# Patient Record
Sex: Female | Born: 1950 | Race: White | Hispanic: No | Smoking: Former smoker
Health system: Southern US, Community
[De-identification: ages and names within clinical notes are randomized; demographics above are authoritative.]

## PROBLEM LIST (undated history)

## (undated) DIAGNOSIS — L409 Psoriasis, unspecified: Secondary | ICD-10-CM

## (undated) DIAGNOSIS — F0280 Dementia in other diseases classified elsewhere without behavioral disturbance: Principal | ICD-10-CM

## (undated) DIAGNOSIS — B351 Tinea unguium: Secondary | ICD-10-CM

## (undated) DIAGNOSIS — I10 Essential (primary) hypertension: Secondary | ICD-10-CM

## (undated) DIAGNOSIS — E559 Vitamin D deficiency, unspecified: Secondary | ICD-10-CM

## (undated) DIAGNOSIS — M8580 Other specified disorders of bone density and structure, unspecified site: Secondary | ICD-10-CM

## (undated) DIAGNOSIS — H409 Unspecified glaucoma: Secondary | ICD-10-CM

## (undated) DIAGNOSIS — Z550 Illiteracy and low-level literacy: Secondary | ICD-10-CM

## (undated) DIAGNOSIS — G309 Alzheimer's disease, unspecified: Principal | ICD-10-CM

## (undated) DIAGNOSIS — R001 Bradycardia, unspecified: Secondary | ICD-10-CM

## (undated) HISTORY — DX: Essential (primary) hypertension: I10

## (undated) HISTORY — PX: TONSILLECTOMY: SUR1361

## (undated) HISTORY — DX: Dementia in other diseases classified elsewhere without behavioral disturbance: F02.80

## (undated) HISTORY — DX: Vitamin D deficiency, unspecified: E55.9

## (undated) HISTORY — DX: Alzheimer's disease, unspecified: G30.9

## (undated) HISTORY — PX: TUBAL LIGATION: SHX77

## (undated) HISTORY — DX: Other specified disorders of bone density and structure, unspecified site: M85.80

## (undated) HISTORY — DX: Tinea unguium: B35.1

## (undated) HISTORY — DX: Psoriasis, unspecified: L40.9

## (undated) HISTORY — DX: Bradycardia, unspecified: R00.1

## (undated) HISTORY — DX: Unspecified glaucoma: H40.9

## (undated) HISTORY — DX: Illiteracy and low-level literacy: Z55.0

## (undated) HISTORY — PX: CHOLECYSTECTOMY: SHX55

---

## 1968-01-12 HISTORY — PX: APPENDECTOMY: SHX54

## 1986-01-11 HISTORY — PX: OTHER SURGICAL HISTORY: SHX169

## 2012-05-24 HISTORY — PX: OTHER SURGICAL HISTORY: SHX169

## 2012-05-25 ENCOUNTER — Other Ambulatory Visit: Payer: Self-pay | Admitting: Physician Assistant

## 2012-05-25 ENCOUNTER — Other Ambulatory Visit (HOSPITAL_COMMUNITY)
Admission: RE | Admit: 2012-05-25 | Discharge: 2012-05-25 | Disposition: A | Discharge status: Home / Self Care | Payer: BC Managed Care – PPO | Source: Ambulatory Visit | Attending: Family Medicine | Admitting: Family Medicine

## 2012-05-25 DIAGNOSIS — Z01419 Encounter for gynecological examination (general) (routine) without abnormal findings: Secondary | ICD-10-CM | POA: Insufficient documentation

## 2012-07-06 ENCOUNTER — Other Ambulatory Visit: Payer: Self-pay

## 2012-07-06 DIAGNOSIS — Z1231 Encounter for screening mammogram for malignant neoplasm of breast: Secondary | ICD-10-CM

## 2012-07-20 ENCOUNTER — Ambulatory Visit: Payer: BC Managed Care – PPO

## 2012-08-09 ENCOUNTER — Other Ambulatory Visit: Payer: Self-pay | Admitting: Gastroenterology

## 2012-08-28 ENCOUNTER — Ambulatory Visit
Admission: RE | Admit: 2012-08-28 | Discharge: 2012-08-28 | Disposition: A | Discharge status: Home / Self Care | Payer: BC Managed Care – PPO | Source: Ambulatory Visit

## 2012-08-28 DIAGNOSIS — Z1231 Encounter for screening mammogram for malignant neoplasm of breast: Secondary | ICD-10-CM

## 2014-03-18 DIAGNOSIS — L409 Psoriasis, unspecified: Secondary | ICD-10-CM | POA: Diagnosis not present

## 2014-03-18 DIAGNOSIS — R413 Other amnesia: Secondary | ICD-10-CM | POA: Diagnosis not present

## 2014-03-19 ENCOUNTER — Other Ambulatory Visit: Payer: Self-pay | Admitting: Family Medicine

## 2014-03-19 DIAGNOSIS — R413 Other amnesia: Secondary | ICD-10-CM

## 2014-04-26 DIAGNOSIS — R944 Abnormal results of kidney function studies: Secondary | ICD-10-CM | POA: Diagnosis not present

## 2014-05-03 ENCOUNTER — Other Ambulatory Visit: Payer: Self-pay

## 2014-05-15 ENCOUNTER — Ambulatory Visit
Admission: RE | Admit: 2014-05-15 | Discharge: 2014-05-15 | Disposition: A | Discharge status: Home / Self Care | Payer: Medicare Other | Source: Ambulatory Visit | Attending: Family Medicine | Admitting: Family Medicine

## 2014-05-15 DIAGNOSIS — F0390 Unspecified dementia without behavioral disturbance: Secondary | ICD-10-CM | POA: Diagnosis not present

## 2014-05-15 DIAGNOSIS — R413 Other amnesia: Secondary | ICD-10-CM | POA: Diagnosis not present

## 2014-06-05 DIAGNOSIS — L400 Psoriasis vulgaris: Secondary | ICD-10-CM | POA: Diagnosis not present

## 2014-06-21 ENCOUNTER — Ambulatory Visit (INDEPENDENT_AMBULATORY_CARE_PROVIDER_SITE_OTHER): Payer: Medicare Other | Admitting: Neurology

## 2014-06-21 ENCOUNTER — Encounter: Payer: Self-pay | Admitting: Neurology

## 2014-06-21 VITALS — BP 148/80 | HR 79 | Ht 64.0 in | Wt 151.6 lb

## 2014-06-21 DIAGNOSIS — G309 Alzheimer's disease, unspecified: Secondary | ICD-10-CM | POA: Diagnosis not present

## 2014-06-21 DIAGNOSIS — E538 Deficiency of other specified B group vitamins: Secondary | ICD-10-CM | POA: Diagnosis not present

## 2014-06-21 DIAGNOSIS — F0280 Dementia in other diseases classified elsewhere without behavioral disturbance: Secondary | ICD-10-CM

## 2014-06-21 HISTORY — DX: Dementia in other diseases classified elsewhere, unspecified severity, without behavioral disturbance, psychotic disturbance, mood disturbance, and anxiety: F02.80

## 2014-06-21 MED ORDER — SERTRALINE HCL 50 MG PO TABS: ORAL_TABLET | ORAL | Status: AC

## 2014-06-21 NOTE — Patient Instructions (Addendum)
Will begin Zoloft as an antidepressive medication, contact our office in about 4 weeks. If she is tolerating the Zoloft well, we will add a medication for memory at that time.   Alzheimer Disease Alzheimer disease is a mental disorder. It causes memory loss and loss of other mental functions, such as learning, thinking, problem solving, communicating, and completing tasks. The mental losses interfere with the ability to perform daily activities at work, at home, or in social situations. Alzheimer disease usually starts in a person's late 1s or early 68s but can start earlier in life (familial form). The mental changes caused by this disease are permanent and worsen over time. As the illness progresses, the ability to do even the simplest things is lost. Survival with Alzheimer disease ranges from several years to as long as 20 years. CAUSES Alzheimer disease is caused by abnormally high levels of a protein (beta-amyloid) in the brain. This protein forms very small deposits within and around the brain's nerve cells. These deposits prevent the nerve cells from working properly. Experts are not certain what causes the beta-amyloid deposits in this disease. RISK FACTORS The following major risk factors have been identified:  Increasing age.  Certain genetic variations, such as Down syndrome (trisomy 21). SYMPTOMS In the early stages of Alzheimer disease, you are still able to perform daily activities but need greater effort, more time, or memory aids. Early symptoms include:  Mild memory loss of recent events, names, or phone numbers.  Loss of objects.  Minor loss of vocabulary.  Difficulty with complex tasks, such as paying bills or driving in unfamiliar locations. Other mental functions deteriorate as the disease worsens. These changes slowly go from mild to severe. Symptoms at this stage include:  Difficulty remembering. You may not be able to recall personal information such as your  address and telephone number. You may become confused about the date, the season of the year, or your location.  Difficulty maintaining attention. You may forget what you wanted to say during conversations and repeat what you have already said.  Difficulty learning new information or tasks. You may not remember what you read or the name of a new friend you met.  Difficulty counting or doing math. You may have difficulty with complex math problems. You may make mistakes in paying bills or managing your checkbook.  Poor reasoning and judgment. You may make poor decisions or not dress right for the weather.  Difficulty communicating. You may have regular difficulty remembering words, naming objects, expressing yourself clearly, or writing sentences that make sense.  Difficulty performing familiar daily activities. You may get lost driving in familiar locations or need help eating, bathing, dressing, grooming, or using the toilet. You may have difficulty maintaining bladder or bowel control.  Difficulty recognizing familiar faces. You may confuse family members or close friends with one another. You may not recognize a close relative or may mistake strangers for family. Alzheimer disease also may cause changes in personality and behavior. These changes include:   Loss of interest or motivation.  Social withdrawal.  Anxiety.  Difficulty sleeping.  Uncharacteristic anger or combativeness.  A false belief that someone is trying to harm you (paranoia).  Seeing things that are not real (hallucinations).  Agitation. Confusion and disruptive behavior are often worse at night and may be triggered by changes in the environment or acute medical issues. DIAGNOSIS  Alzheimer disease is diagnosed through an assessment by your health care provider. During this assessment, your health  care provider will do the following:  Ask you and your family, friends, or caregivers questions about your symptoms,  their frequency, their duration and progression, and the effect they are having on your life.  Ask questions about your personal and family medical history and use of alcohol or drugs, including prescription medicine.  Perform a physical exam and order blood tests and brain imaging exams. Your health care provider may refer you to a specialist for detailed evaluation of your mental functions (neuropsychological testing).  Many different brain disorders, medical conditions, and certain substances can cause symptoms that resemble Alzheimer disease symptoms. These must be ruled out before this disease can be diagnosed. If Alzheimer disease is diagnosed, it will be considered either "possible" or "probable" Alzheimer disease. "Possible" Alzheimer disease means that your symptoms are typical of the disease and no other disorder is causing them. "Probable" Alzheimer disease means that you also have a family history of the disease or genetic test results that support the diagnosis. Certain tests, mostly used in research studies, are highly specific for Alzheimer disease.  TREATMENT  There is currently no cure for this disease. The goals of treatment are to:  Slow down the progression of the disease.  Preserve mental function as long as possible.  Manage behavioral symptoms.  Make life easier for the person with Alzheimer disease and his or her caregivers. The following treatment options are available:  Medicine. Certain medicines may help slow memory loss by changing the level of certain chemicals in the brain. Medicine may also help with behavioral symptoms.  Talk therapy. Talk therapy provides education, support, and memory aids for people with this disease. It is most effective in the early stages of the illness.  Caregiving. Caregivers may be family members, friends, or trained medical professionals. They help the person with Alzheimer disease with daily life activities. Caregiving may take place  at home or at a nursing facility.  Family support groups. These provide education, emotional support, and information about community resources to family members who are taking care of the person with this disease. Document Released: 09/09/2003 Document Revised: 05/14/2013 Document Reviewed: 05/05/2012 [facility] Patient Information 2015 [place], [place]. This information is not intended to replace advice given to you by your health care provider. Make sure you discuss any questions you have with your health care provider.

## 2014-06-21 NOTE — Progress Notes (Signed)
Reason for visit: Dementia  Referring physician: Dr. Johnette Abraham is a 64 y.o. female  History of present illness:  Briana Taylor is a 64 year old right-handed white female with a history of a progressive memory disorder. The patient apparently has never been a high functioning individual, and she has always been dependent upon others to meet her day-to-day needs. Her daughter has been living with her since 2007, the patient has always been mentally slow. She was able to do laundry, cook, and take care of her personal needs without difficulty. The daughter got married in June 2015, and within 2 months of this, the patient began becoming oppositional, refusing to do any household chores. The patient has progressively worsened with her behavior over time, she is refusing to dress herself or take a bath without prompting. The patient has had a decrease in her oral intake of food and fluids, and this issue requires supervision. The patient has begun eating cat food more recently. She sleeps during the day, and is up and down at night. She will talk herself in a self degradating manner, and voice suicidal wishes. The patient has lost interest in activities, she no longer does crossword puzzles or does coloring as she used to. The patient has had blood work done that shows a low normal vitamin B12 level of 215. MRI evaluation of the brain shows some atrophy and minimal small vessel changes. The patient comes to this office for an evaluation.  Past Medical History  Diagnosis Date  . Glaucoma   . Psoriasis   . Illiterate     learning disability  . Hypertension   . Vitamin D deficiency   . Bradycardia   . Tinea unguium   . Glaucoma   . Osteopenia   . Alzheimer's disease 06/21/2014    Past Surgical History  Procedure Laterality Date  . Appendectomy  1970  . Fx thumb  1988  . Tonsillectomy    . Right glaucoma  05/24/12  . Cholecystectomy    . Tubal ligation      Family History  Problem  Relation Age of Onset  . Heart attack Mother   . Hyperlipidemia Mother   . Diabetes Mother   . Hypertension Mother   . CAD Mother   . Hypertension Maternal Grandmother   . Hypertension Maternal Grandfather   . Hypertension Paternal Grandmother   . Hypertension Paternal Grandfather     Social history:  reports that she has quit smoking. She has never used smokeless tobacco. She reports that she does not drink alcohol or use illicit drugs.  Medications:  Prior to Admission medications   Medication Sig Start Date End Date Taking? Authorizing Provider  b complex vitamins tablet Take 1 tablet by mouth daily.   Yes Historical Provider, MD  calcium-vitamin D (OSCAL) 250-125 MG-UNIT per tablet Take 1 tablet by mouth daily.   Yes Historical Provider, MD  Multiple Vitamins-Minerals (CENTRUM SILVER ADULT 50+ PO) Take by mouth.   Yes Historical Provider, MD  Omega-3 Fatty Acids (OMEGA 3 PO) Take 1 capsule by mouth daily.   Yes Historical Provider, MD  clobetasol cream (TEMOVATE) 0.05 % Apply 1 application topically 2 (two) times daily.    Historical Provider, MD     No Known Allergies  ROS:  Out of a complete 14 system review of symptoms, the patient complains only of the following symptoms, and all other reviewed systems are negative.  Psoriasis Memory loss, confusion, headache, tremor Depression, anxiety, too  much sleep, decreased energy, disinterest in activities, suicidal thoughts Sleepiness  Blood pressure 148/80, pulse 79, height  (1.626 m), weight 151 lb 9.6 oz (68.765 kg).  Physical Exam  General: The patient is alert and cooperative at the time of the examination.  Eyes: Pupils are equal, round, and reactive to light. Discs are flat bilaterally.  Neck: The neck is supple, no carotid bruits are noted.  Respiratory: The respiratory examination is clear.  Cardiovascular: The cardiovascular examination reveals a regular rate and rhythm, no obvious murmurs or rubs are  noted.  Skin: Extremities are without significant edema. Psoriasis lesions are seen throughout on the arms and legs.  Neurologic Exam  Mental status: The patient is alert and oriented x 2 at the time of the examination (the patient is not oriented to date). The Mini-Mental Status Examination done today shows a total score of 17/30. The patient is able to name 7 animals in 30 seconds.  Cranial nerves: Facial symmetry is present. There is good sensation of the face to pinprick and soft touch bilaterally. The strength of the facial muscles and the muscles to head turning and shoulder shrug are normal bilaterally. Speech is well enunciated, no aphasia or dysarthria is noted. Extraocular movements are full. Visual fields are full. The tongue is midline, and the patient has symmetric elevation of the soft palate. No obvious hearing deficits are noted.  Motor: The motor testing reveals 5 over 5 strength of all 4 extremities. Good symmetric motor tone is noted throughout.  Sensory: Sensory testing is intact to pinprick, soft touch, vibration sensation, and position sense on all 4 extremities. No evidence of extinction is noted.  Coordination: Cerebellar testing reveals good finger-nose-finger and heel-to-shin bilaterally.  Gait and station: Gait is normal. Tandem gait is slightly unsteady. Romberg is negative. No drift is seen.  Reflexes: Deep tendon reflexes are symmetric and normal bilaterally. Toes are downgoing bilaterally.   MRI brain 05/15/14:  IMPRESSION: 1. No acute intracranial abnormality. 2. Nonspecific mild generalized atrophy and white matter change, advanced for age. This may be related to chronic microvascular ischemic disease. 3. The study is moderately degraded by patient motion  * MRI scan images were reviewed online. I agree with the written report.    Assessment/Plan:  1. Dementing illness  2. Depression  The patient likely has a combination of a progressive  dementing illness along with depression. The patient has always functioned at a relatively low level, but the patient recently has had a decline in her ability to perform activities of daily living. The clinical examination does not show significant apraxia. The patient appears to be functioning at a lower level than would be expected from the Mini-Mental Status Examination scores. Depression is likely driving much of her current behavioral issues. The patient will be placed on Zoloft, and the family will contact me after 4 weeks. If she is doing well with this, we will add Namenda to the medication regimen. The patient will follow-up in 6 months. A methylmalonic acid level rechecked today. The patient will go on low-dose aspirin.  Marlan Palau MD 06/21/2014 6:40 PM  [facility] [address] [place], [place] [address]  Phone [phone] Fax [phone]

## 2014-06-24 LAB — METHYLMALONIC ACID, SERUM: METHYLMALONIC ACID: 296 nmol/L (ref 0–378)

## 2014-06-25 ENCOUNTER — Telehealth: Payer: Self-pay

## 2014-06-25 NOTE — Telephone Encounter (Signed)
-----   Message from York Spaniel, MD sent at 06/24/2014  4:56 PM EDT -----  The blood work results are unremarkable. Please call the patient.  ----- Message -----    From: [facility] Lab Results In Interface    Sent: 06/24/2014   4:38 PM      To: York Spaniel, MD

## 2014-06-25 NOTE — Telephone Encounter (Signed)
I spoke to Hidalgo and relayed results.

## 2014-07-29 ENCOUNTER — Telehealth: Payer: Self-pay | Admitting: Neurology

## 2014-07-29 MED ORDER — MEMANTINE HCL 28 X 5 MG & 21 X 10 MG PO TABS: ORAL_TABLET | ORAL | Status: DC

## 2014-07-29 NOTE — Telephone Encounter (Signed)
I called the patient, talk with a caregiver. The patient is at increased issues with agitation, confused behavior. I will start Namenda at this time. They are to contact me when they finished the Dosepak and I will call in a maintenance dose for her.

## 2014-07-29 NOTE — Telephone Encounter (Signed)
Patients daughter called and requested to speak with the nurse regarding the medication Dr. Anne HahnWillis gave to the patient during her last apt. Cicero Duckrika states that the patient has been using the bathroom in the house plants and this concerns her. She would like to know if she can start medication to help with her dementia. Please call and advise.

## 2014-07-31 ENCOUNTER — Other Ambulatory Visit: Payer: Self-pay

## 2014-07-31 ENCOUNTER — Telehealth: Payer: Self-pay | Admitting: Neurology

## 2014-07-31 DIAGNOSIS — Z1231 Encounter for screening mammogram for malignant neoplasm of breast: Secondary | ICD-10-CM

## 2014-07-31 NOTE — Telephone Encounter (Signed)
Pts , daughter called to talk about medication, would like to talk with someone about the new medication , is it to used with Zoloft as well? The daughter is very concerned about her mothers medication and what to do in general. Please call and advise [phone]- work number.

## 2014-07-31 NOTE — Telephone Encounter (Signed)
I called Briana Taylor and left a voicemail.

## 2014-08-01 NOTE — Telephone Encounter (Signed)
Erika returned call. Please call and advise.

## 2014-08-01 NOTE — Telephone Encounter (Signed)
I called the patient's daughter. I explained that Dr. Anne Hahn wanted the patient to continue taking the Zoloft and add the Namenda. I also explained the titration pak to the patient's daughter and that she would need to call back when she is nearing the end of the Namenda so we can call in a maintenance dose. She verbalized understanding and will call back if she has anymore questions.

## 2014-08-27 DIAGNOSIS — F329 Major depressive disorder, single episode, unspecified: Secondary | ICD-10-CM | POA: Diagnosis not present

## 2014-08-27 DIAGNOSIS — G309 Alzheimer's disease, unspecified: Secondary | ICD-10-CM | POA: Diagnosis not present

## 2014-08-27 DIAGNOSIS — I10 Essential (primary) hypertension: Secondary | ICD-10-CM | POA: Diagnosis not present

## 2014-08-27 DIAGNOSIS — N183 Chronic kidney disease, stage 3 (moderate): Secondary | ICD-10-CM | POA: Diagnosis not present

## 2014-09-12 ENCOUNTER — Ambulatory Visit
Admission: RE | Admit: 2014-09-12 | Discharge: 2014-09-12 | Disposition: A | Discharge status: Home / Self Care | Payer: Medicare Other | Source: Ambulatory Visit

## 2014-09-12 DIAGNOSIS — Z1231 Encounter for screening mammogram for malignant neoplasm of breast: Secondary | ICD-10-CM

## 2014-10-18 ENCOUNTER — Other Ambulatory Visit: Payer: Self-pay | Admitting: Neurology

## 2014-10-18 MED ORDER — MEMANTINE HCL 10 MG PO TABS: 10.0000 mg | ORAL_TABLET | Freq: Two times a day (BID) | ORAL | Status: AC

## 2014-10-26 DIAGNOSIS — H5213 Myopia, bilateral: Secondary | ICD-10-CM | POA: Diagnosis not present

## 2014-10-26 DIAGNOSIS — H524 Presbyopia: Secondary | ICD-10-CM | POA: Diagnosis not present

## 2014-10-26 DIAGNOSIS — H401130 Primary open-angle glaucoma, bilateral, stage unspecified: Secondary | ICD-10-CM | POA: Diagnosis not present

## 2014-10-26 DIAGNOSIS — H40033 Anatomical narrow angle, bilateral: Secondary | ICD-10-CM | POA: Diagnosis not present

## 2014-10-26 DIAGNOSIS — H52223 Regular astigmatism, bilateral: Secondary | ICD-10-CM | POA: Diagnosis not present

## 2014-12-25 ENCOUNTER — Ambulatory Visit: Payer: Medicare Other | Admitting: Adult Health

## 2014-12-26 ENCOUNTER — Encounter: Payer: Self-pay | Admitting: Adult Health

## 2015-08-11 ENCOUNTER — Other Ambulatory Visit: Payer: Self-pay | Admitting: Family Medicine

## 2015-08-11 DIAGNOSIS — Z1231 Encounter for screening mammogram for malignant neoplasm of breast: Secondary | ICD-10-CM

## 2015-08-29 ENCOUNTER — Telehealth: Payer: Self-pay

## 2015-08-29 NOTE — Telephone Encounter (Addendum)
Patient was in the lobby waiting for someone who was being seen in our office today. She had been witnessed by several patients and employees eating her stool from her depends diapers. Gwen was concerned and wanted someone to check on the patient and see what we needed to do since she was not a patient at our office. When I went to check on the patient because we were instructed by Dr.Blyth to sit the patient in a room away from other patient's, she was reaching into her pants and I witnessed her sucking the stool off of her fingers. I asked did she need to use the bathroom and she initially said no, I told her I would help her and then she said yes. When I helped her into the restroom with the assistance of Festus Barrenanesha and Clydie BraunKaren, we cleaned her up and got another depends diaper from the ED. She has diaper rash on her bottom, and sore all over her body.  I asked had she eaten and she said no, she said she was very hungry. I explained to Dr.Lowne what was going on and was [facility] that we had some pizza left in the back from lunch. Dr.Lowne advised it was ok to give the pizza and we warmed it up and gave it to her, Dr.Paz gave us a Sprit Zero to give to the patient. She ate the food and said she felt better. I asked the patient some questions regarding her living situation because she said she was hungry and she said she did not have much food at home, and she daughter does not come and see her, she stated that she and Zollie BeckersWalter took care of each other. She said she did not drive and Zollie BeckersWalter took her everywhere. She gave me her name and her daughter's name and then told me her daughter lived at home with her, but she never see's her. I discussed with D.Lowne, Abner GreenspanBlyth and Ramon Dredgedward who advised to call APS. I made the call and left a VM, I will follow up on Monday. [place] has been notified.

## 2015-09-01 NOTE — Telephone Encounter (Signed)
Report has been given to Adult protective services to check on the patient.    KP

## 2015-09-16 ENCOUNTER — Ambulatory Visit
Admission: RE | Admit: 2015-09-16 | Discharge: 2015-09-16 | Disposition: A | Discharge status: Home / Self Care | Payer: Medicare Other | Source: Ambulatory Visit | Attending: Family Medicine | Admitting: Family Medicine

## 2015-09-16 DIAGNOSIS — Z1231 Encounter for screening mammogram for malignant neoplasm of breast: Secondary | ICD-10-CM

## 2016-07-21 ENCOUNTER — Other Ambulatory Visit: Payer: Self-pay | Admitting: Family Medicine

## 2016-07-21 DIAGNOSIS — Z1231 Encounter for screening mammogram for malignant neoplasm of breast: Secondary | ICD-10-CM

## 2016-09-17 ENCOUNTER — Ambulatory Visit
Admission: RE | Admit: 2016-09-17 | Discharge: 2016-09-17 | Disposition: A | Discharge status: Home / Self Care | Payer: Medicare Other | Source: Ambulatory Visit | Attending: Family Medicine | Admitting: Family Medicine

## 2016-09-17 DIAGNOSIS — Z1231 Encounter for screening mammogram for malignant neoplasm of breast: Secondary | ICD-10-CM

## 2017-03-19 IMAGING — MR MR HEAD W/O CM
10 series · 47 of 48 positions shown · non-contrast
Comparison: None.

CLINICAL DATA: Memory loss.  Dementia.

EXAM:
MRI HEAD WITHOUT CONTRAST
TECHNIQUE: Multiplanar, multiecho pulse sequences of the brain and surrounding
structures were obtained without intravenous contrast.

[Series 3: ep2d_diff_(id)_trace · axial · 3.0mm · 1.80mm/px · z∈[-33,+114]mm · 9 of 100 slices shown]
[im 1/100]
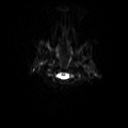
[im 13/100]
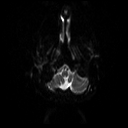
[im 25/100]
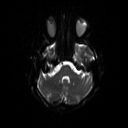
[im 38/100]
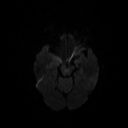
[im 50/100]
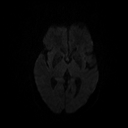
[im 62/100]
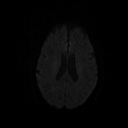
[im 75/100]
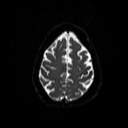
[im 87/100]
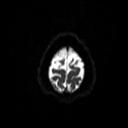
[im 100/100]
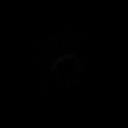

[Series 4: ep2d_diff_(id)_trace_adc · axial · 3.0mm · 1.80mm/px · z∈[-33,+114]mm · 5 of 50 slices shown]
[im 1/50]
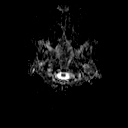
[im 13/50]
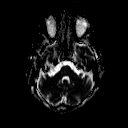
[im 25/50]
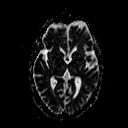
[im 37/50]
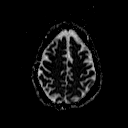
[im 50/50]
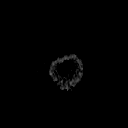

[Series 5: t1_se_sag · sagittal · 5.0mm · 0.45mm/px · 2 of 21 slices shown]
[im 1/21]
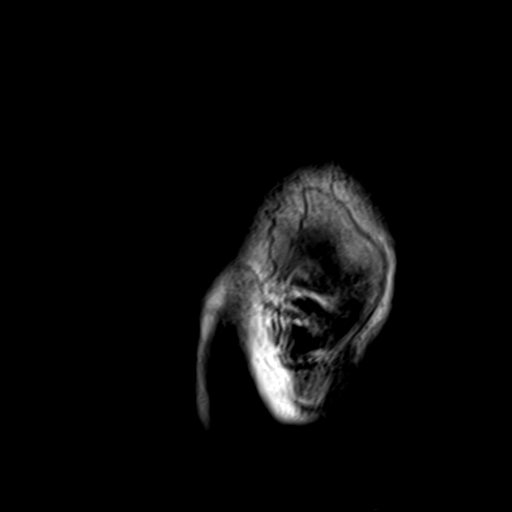
[im 21/21]
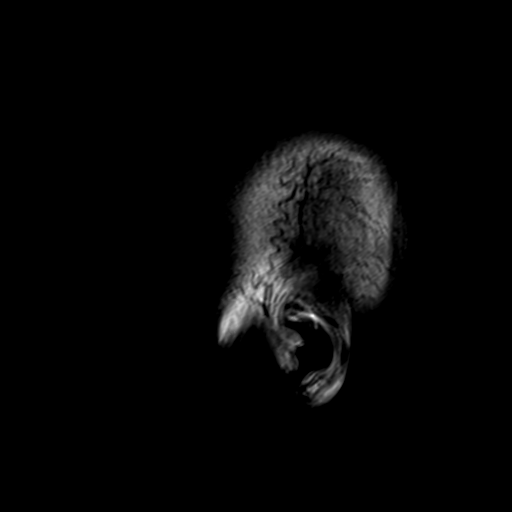

[Series 7: swi_images · axial · 2.0mm · 0.90mm/px · z∈[-39,+119]mm · 8 of 80 slices shown]
[im 1/80]
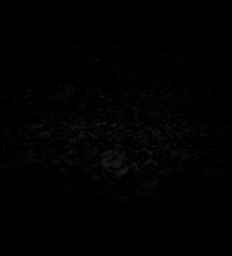
[im 12/80]
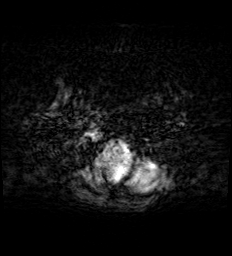
[im 23/80]
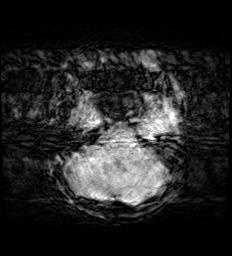
[im 34/80]
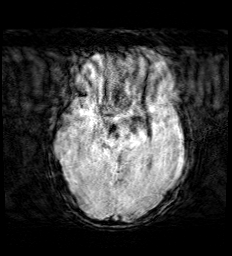
[im 46/80]
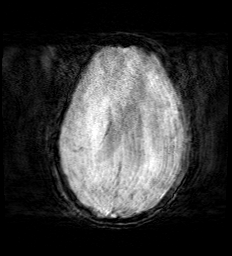
[im 57/80]
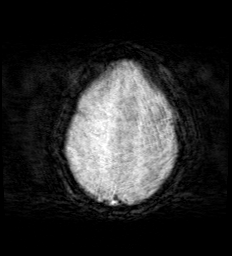
[im 68/80]
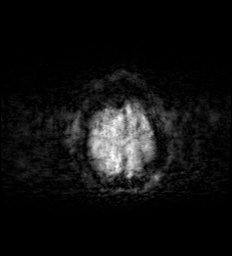
[im 80/80]
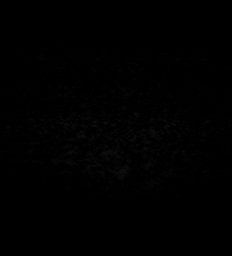

[Series 8: ep2d_diff cor for · coronal · 5.0mm · 0.90mm/px · 6 of 68 slices shown (1 of 2)]
[im 1/68]
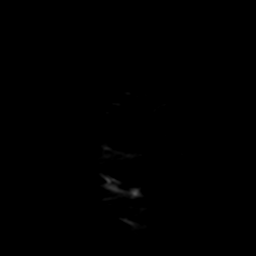
[im 14/68]
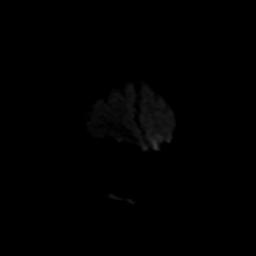
[im 27/68]
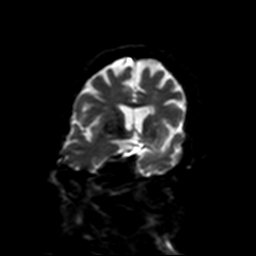
[im 41/68]
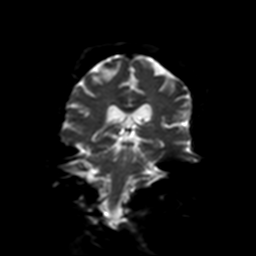
[im 54/68]
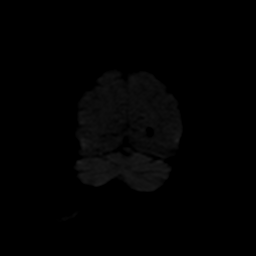
[im 68/68]
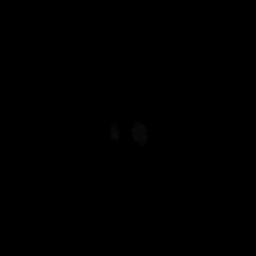

[Series 9: ep2d_diff cor for · coronal · 5.0mm · 0.90mm/px · 3 of 34 slices shown (2 of 2)]
[im 1/34]
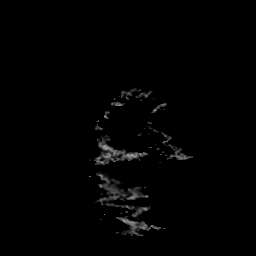
[im 17/34]
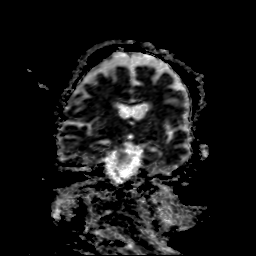
[im 34/34]
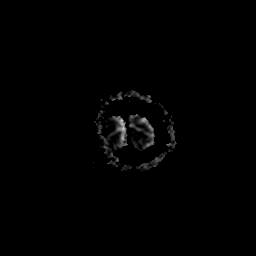

[Series 10: t2_tse_tra_512 · axial · 5.0mm · 0.60mm/px · z∈[-40,+103]mm · 2 of 24 slices shown]
[im 1/24]
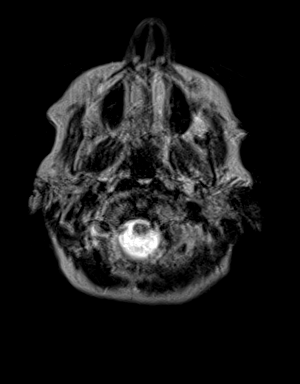
[im 24/24]
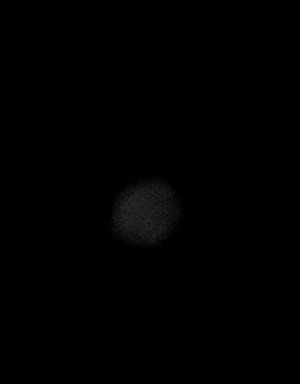

[Series 11: FLAIR · axial · 5.0mm · 0.45mm/px · z∈[-33,+104]mm · 2 of 23 slices shown]
[im 1/23]
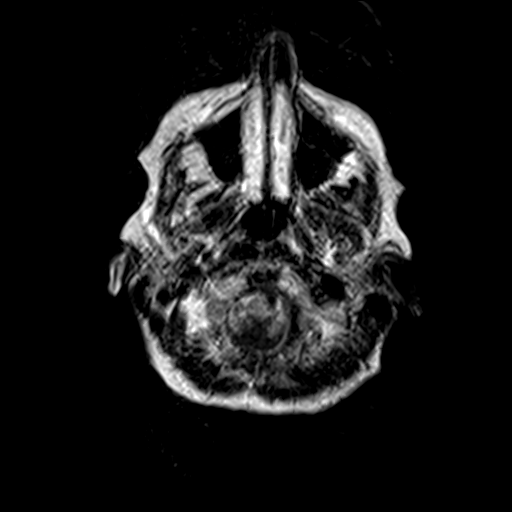
[im 23/23]
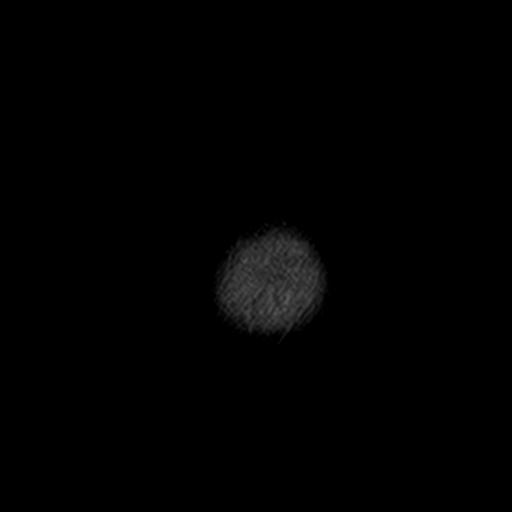

[Series 12: t1_mpr_tra · axial · 2.0mm · 0.45mm/px · z∈[-43,+90]mm · 7 of 80 slices shown]
[im 1/80]
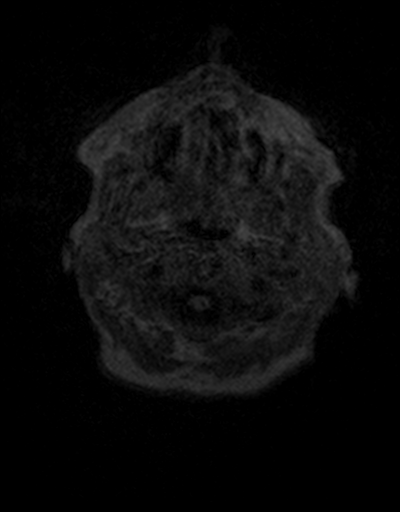
[im 12/80]
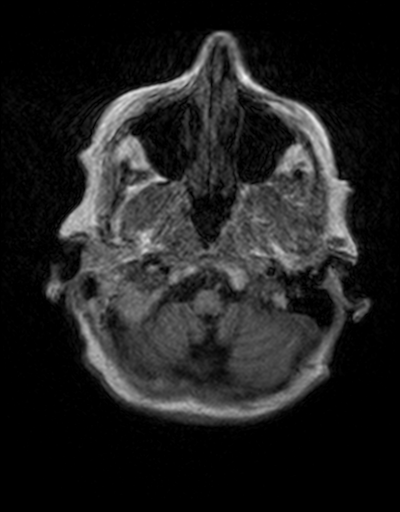
[im 23/80]
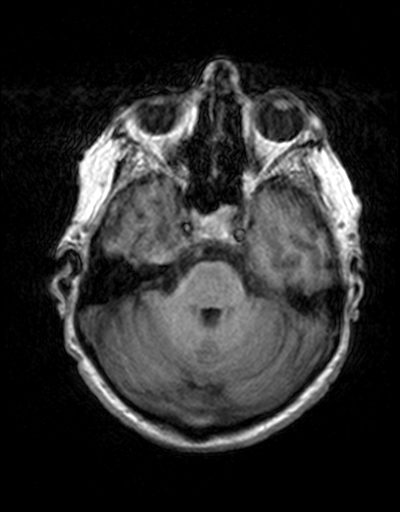
[im 34/80]
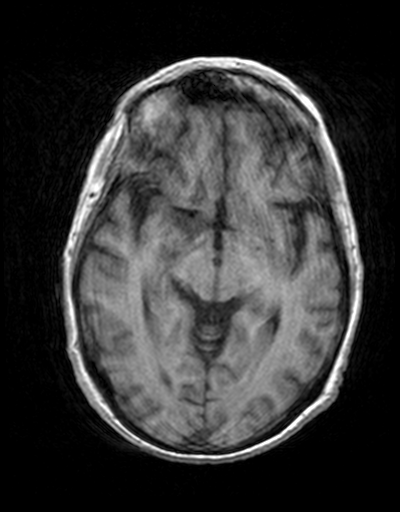
[im 46/80]
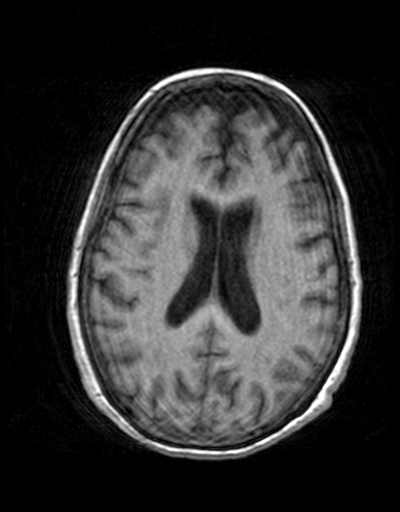
[im 57/80]
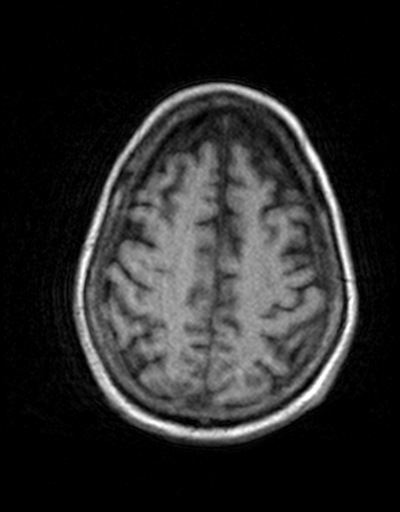
[im 68/80]
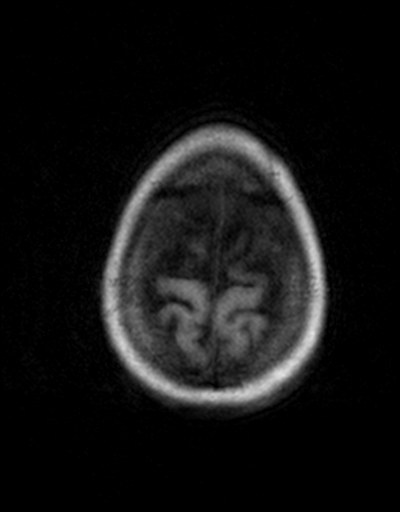

[Series 13: T2 · coronal · 5.0mm · 0.45mm/px · 3 of 28 slices shown]
[im 1/28]
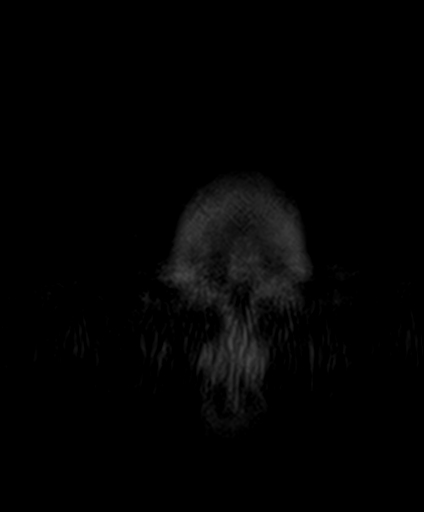
[im 14/28]
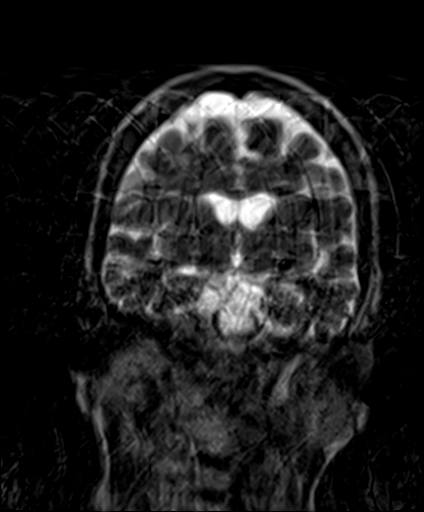
[im 28/28]
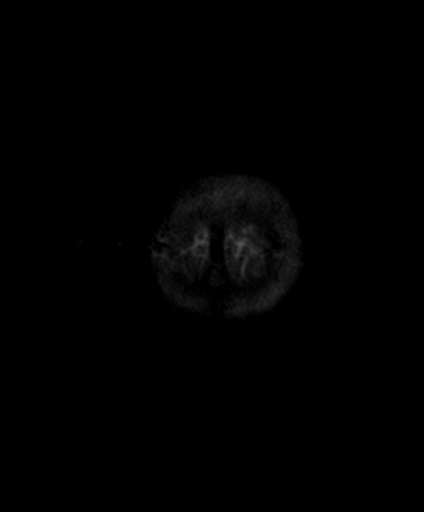

[47 of 48 positions shown; findings below may reference images not displayed]

FINDINGS: The study is moderately degraded by patient motion. Mild generalized
atrophy is nonspecific min pattern. Mild periventricular and
subcortical T2 changes are evident bilaterally. White matter changes
are diminished by motion.

No acute infarct, hemorrhage, or mass lesion is present. The
ventricles are of normal size.

Flow is present in the major intracranial arteries. The globes and
orbits are intact. Midline structures are within normal limits.
IMPRESSION: 1. No acute intracranial abnormality.
2. Nonspecific mild generalized atrophy and white matter change,
advanced for age. This may be related to chronic microvascular
ischemic disease.
3. The study is moderately degraded by patient motion

## 2017-08-03 ENCOUNTER — Other Ambulatory Visit: Payer: Self-pay | Admitting: Family Medicine

## 2017-08-03 DIAGNOSIS — Z1231 Encounter for screening mammogram for malignant neoplasm of breast: Secondary | ICD-10-CM

## 2017-09-23 ENCOUNTER — Ambulatory Visit
Admission: RE | Admit: 2017-09-23 | Discharge: 2017-09-23 | Disposition: A | Discharge status: Home / Self Care | Payer: Medicare Other | Source: Ambulatory Visit | Attending: Family Medicine | Admitting: Family Medicine

## 2017-09-23 DIAGNOSIS — Z1231 Encounter for screening mammogram for malignant neoplasm of breast: Secondary | ICD-10-CM | POA: Diagnosis not present

## 2018-01-20 ENCOUNTER — Inpatient Hospital Stay (HOSPITAL_COMMUNITY)
Admission: EM | Admit: 2018-01-20 | Discharge: 2018-02-11 | DRG: 871 | Disposition: E | Payer: Medicare Other | Attending: Pulmonary Disease | Admitting: Pulmonary Disease

## 2018-01-20 DIAGNOSIS — R7401 Elevation of levels of liver transaminase levels: Secondary | ICD-10-CM

## 2018-01-20 DIAGNOSIS — Z66 Do not resuscitate: Secondary | ICD-10-CM | POA: Diagnosis not present

## 2018-01-20 DIAGNOSIS — F329 Major depressive disorder, single episode, unspecified: Secondary | ICD-10-CM | POA: Diagnosis present

## 2018-01-20 DIAGNOSIS — E876 Hypokalemia: Secondary | ICD-10-CM | POA: Diagnosis present

## 2018-01-20 DIAGNOSIS — I10 Essential (primary) hypertension: Secondary | ICD-10-CM | POA: Diagnosis present

## 2018-01-20 DIAGNOSIS — R4182 Altered mental status, unspecified: Secondary | ICD-10-CM

## 2018-01-20 DIAGNOSIS — R9431 Abnormal electrocardiogram [ECG] [EKG]: Secondary | ICD-10-CM | POA: Diagnosis present

## 2018-01-20 DIAGNOSIS — I4891 Unspecified atrial fibrillation: Secondary | ICD-10-CM | POA: Diagnosis present

## 2018-01-20 DIAGNOSIS — A408 Other streptococcal sepsis: Principal | ICD-10-CM | POA: Diagnosis present

## 2018-01-20 DIAGNOSIS — E559 Vitamin D deficiency, unspecified: Secondary | ICD-10-CM | POA: Diagnosis present

## 2018-01-20 DIAGNOSIS — G9340 Encephalopathy, unspecified: Secondary | ICD-10-CM | POA: Diagnosis not present

## 2018-01-20 DIAGNOSIS — G309 Alzheimer's disease, unspecified: Secondary | ICD-10-CM | POA: Diagnosis present

## 2018-01-20 DIAGNOSIS — Z7982 Long term (current) use of aspirin: Secondary | ICD-10-CM

## 2018-01-20 DIAGNOSIS — F0280 Dementia in other diseases classified elsewhere without behavioral disturbance: Secondary | ICD-10-CM | POA: Diagnosis present

## 2018-01-20 DIAGNOSIS — Z789 Other specified health status: Secondary | ICD-10-CM

## 2018-01-20 DIAGNOSIS — R6521 Severe sepsis with septic shock: Secondary | ICD-10-CM | POA: Diagnosis present

## 2018-01-20 DIAGNOSIS — G9341 Metabolic encephalopathy: Secondary | ICD-10-CM | POA: Diagnosis present

## 2018-01-20 DIAGNOSIS — Z91128 Patient's intentional underdosing of medication regimen for other reason: Secondary | ICD-10-CM

## 2018-01-20 DIAGNOSIS — R680 Hypothermia, not associated with low environmental temperature: Secondary | ICD-10-CM | POA: Diagnosis present

## 2018-01-20 DIAGNOSIS — L989 Disorder of the skin and subcutaneous tissue, unspecified: Secondary | ICD-10-CM | POA: Diagnosis present

## 2018-01-20 DIAGNOSIS — T7601XA Adult neglect or abandonment, suspected, initial encounter: Secondary | ICD-10-CM | POA: Diagnosis present

## 2018-01-20 DIAGNOSIS — J9600 Acute respiratory failure, unspecified whether with hypoxia or hypercapnia: Secondary | ICD-10-CM | POA: Diagnosis present

## 2018-01-20 DIAGNOSIS — Z833 Family history of diabetes mellitus: Secondary | ICD-10-CM

## 2018-01-20 DIAGNOSIS — T7401XA Adult neglect or abandonment, confirmed, initial encounter: Secondary | ICD-10-CM

## 2018-01-20 DIAGNOSIS — E871 Hypo-osmolality and hyponatremia: Secondary | ICD-10-CM | POA: Diagnosis present

## 2018-01-20 DIAGNOSIS — Z8249 Family history of ischemic heart disease and other diseases of the circulatory system: Secondary | ICD-10-CM

## 2018-01-20 DIAGNOSIS — N179 Acute kidney failure, unspecified: Secondary | ICD-10-CM

## 2018-01-20 DIAGNOSIS — T438X6A Underdosing of other psychotropic drugs, initial encounter: Secondary | ICD-10-CM | POA: Diagnosis present

## 2018-01-20 DIAGNOSIS — L409 Psoriasis, unspecified: Secondary | ICD-10-CM | POA: Diagnosis present

## 2018-01-20 DIAGNOSIS — D6959 Other secondary thrombocytopenia: Secondary | ICD-10-CM | POA: Diagnosis present

## 2018-01-20 DIAGNOSIS — Z87891 Personal history of nicotine dependence: Secondary | ICD-10-CM

## 2018-01-20 DIAGNOSIS — R740 Nonspecific elevation of levels of transaminase and lactic acid dehydrogenase [LDH]: Secondary | ICD-10-CM | POA: Diagnosis present

## 2018-01-20 DIAGNOSIS — R52 Pain, unspecified: Secondary | ICD-10-CM

## 2018-01-20 DIAGNOSIS — Z8349 Family history of other endocrine, nutritional and metabolic diseases: Secondary | ICD-10-CM

## 2018-01-20 DIAGNOSIS — M8580 Other specified disorders of bone density and structure, unspecified site: Secondary | ICD-10-CM | POA: Diagnosis present

## 2018-01-20 LAB — COMPREHENSIVE METABOLIC PANEL
ALBUMIN: 1.6 g/dL — AB (ref 3.5–5.0)
ALT: 57 U/L — ABNORMAL HIGH (ref 0–44)
ANION GAP: 9 (ref 5–15)
AST: 80 U/L — ABNORMAL HIGH (ref 15–41)
Alkaline Phosphatase: 94 U/L (ref 38–126)
BILIRUBIN TOTAL: 2.6 mg/dL — AB (ref 0.3–1.2)
BUN: 44 mg/dL — ABNORMAL HIGH (ref 8–23)
CO2: 19 mmol/L — ABNORMAL LOW (ref 22–32)
Calcium: 8 mg/dL — ABNORMAL LOW (ref 8.9–10.3)
Chloride: 101 mmol/L (ref 98–111)
Creatinine, Ser: 1.16 mg/dL — ABNORMAL HIGH (ref 0.44–1.00)
GFR calc Af Amer: 56 mL/min — ABNORMAL LOW (ref >60)
GFR, EST NON AFRICAN AMERICAN: 48 mL/min — AB (ref >60)
Glucose, Bld: 106 mg/dL — ABNORMAL HIGH (ref 70–99)
POTASSIUM: 3.2 mmol/L — AB (ref 3.5–5.1)
Sodium: 129 mmol/L — ABNORMAL LOW (ref 135–145)
TOTAL PROTEIN: 4.9 g/dL — AB (ref 6.5–8.1)

## 2018-01-20 LAB — TSH: TSH: 3.448 u[IU]/mL (ref 0.350–4.500)

## 2018-01-20 LAB — CBG MONITORING, ED: Glucose-Capillary: 111 mg/dL — ABNORMAL HIGH (ref 70–99)

## 2018-01-20 LAB — CK: Total CK: 287 U/L — ABNORMAL HIGH (ref 38–234)

## 2018-01-20 MED ORDER — SODIUM CHLORIDE 0.9 % IV BOLUS
1000.0000 mL | Freq: Once | INTRAVENOUS | Status: AC
  Administered 2018-01-20: 1000 mL via INTRAVENOUS

## 2018-01-20 NOTE — ED Notes (Signed)
CBG 111 at 2234

## 2018-01-20 NOTE — ED Notes (Addendum)
CRITICAL VALUE ALERT  Critical Value:  Platelet count: 22  Date & Time Notied:  2018/02/16 @ 2234  Provider Notified: Yes  Orders Received/Actions taken: Pa/MD will place

## 2018-01-20 NOTE — ED Notes (Signed)
Pt. Is a social work consult and Adult protective services case. NO VISITORS ALLOWED!

## 2018-01-20 NOTE — ED Notes (Signed)
Spoke with Kathreen Devoidarrel Griffin from APS about patients status and case. Informed that there will be a representative to see patient within 72 hours.
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
## 2018-01-20 NOTE — ED Notes (Signed)
Called Dept of Social Services Adult Education administrator. They will have a worker call Sarah RN right back.

## 2018-01-20 NOTE — ED Notes (Signed)
ED CBG 111
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
## 2018-01-20 NOTE — ED Triage Notes (Signed)
Pt arrives EMS with slurred speech, ams, R finger lac, R wrist swelling/bruising. LKW Tuesday. Pt lives with family, however family is unable to provide any details. Pt keeps repeating "i'll be good". CBG 109, 128/88, HR 58, RR 18, 99% RA. EKG's reading as STEMI. Pt cold to the touch on arrival. 20LFA.
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
## 2018-01-21 ENCOUNTER — Emergency Department (HOSPITAL_COMMUNITY): Payer: Medicare Other

## 2018-01-21 ENCOUNTER — Inpatient Hospital Stay (HOSPITAL_COMMUNITY): Payer: Medicare Other

## 2018-01-21 DIAGNOSIS — G9341 Metabolic encephalopathy: Secondary | ICD-10-CM | POA: Diagnosis present

## 2018-01-21 DIAGNOSIS — T438X6A Underdosing of other psychotropic drugs, initial encounter: Secondary | ICD-10-CM | POA: Diagnosis present

## 2018-01-21 DIAGNOSIS — Z66 Do not resuscitate: Secondary | ICD-10-CM | POA: Diagnosis not present

## 2018-01-21 DIAGNOSIS — F0280 Dementia in other diseases classified elsewhere without behavioral disturbance: Secondary | ICD-10-CM | POA: Diagnosis present

## 2018-01-21 DIAGNOSIS — D6959 Other secondary thrombocytopenia: Secondary | ICD-10-CM | POA: Diagnosis present

## 2018-01-21 DIAGNOSIS — R6521 Severe sepsis with septic shock: Secondary | ICD-10-CM | POA: Diagnosis present

## 2018-01-21 DIAGNOSIS — G9340 Encephalopathy, unspecified: Secondary | ICD-10-CM | POA: Diagnosis present

## 2018-01-21 DIAGNOSIS — I10 Essential (primary) hypertension: Secondary | ICD-10-CM | POA: Diagnosis present

## 2018-01-21 DIAGNOSIS — E559 Vitamin D deficiency, unspecified: Secondary | ICD-10-CM | POA: Diagnosis present

## 2018-01-21 DIAGNOSIS — L409 Psoriasis, unspecified: Secondary | ICD-10-CM | POA: Diagnosis present

## 2018-01-21 DIAGNOSIS — R680 Hypothermia, not associated with low environmental temperature: Secondary | ICD-10-CM | POA: Diagnosis present

## 2018-01-21 DIAGNOSIS — N179 Acute kidney failure, unspecified: Secondary | ICD-10-CM | POA: Diagnosis present

## 2018-01-21 DIAGNOSIS — J9600 Acute respiratory failure, unspecified whether with hypoxia or hypercapnia: Secondary | ICD-10-CM | POA: Diagnosis present

## 2018-01-21 DIAGNOSIS — Z7982 Long term (current) use of aspirin: Secondary | ICD-10-CM | POA: Diagnosis not present

## 2018-01-21 DIAGNOSIS — L989 Disorder of the skin and subcutaneous tissue, unspecified: Secondary | ICD-10-CM | POA: Diagnosis present

## 2018-01-21 DIAGNOSIS — Z91128 Patient's intentional underdosing of medication regimen for other reason: Secondary | ICD-10-CM | POA: Diagnosis not present

## 2018-01-21 DIAGNOSIS — Z8249 Family history of ischemic heart disease and other diseases of the circulatory system: Secondary | ICD-10-CM | POA: Diagnosis not present

## 2018-01-21 DIAGNOSIS — A408 Other streptococcal sepsis: Secondary | ICD-10-CM | POA: Diagnosis present

## 2018-01-21 DIAGNOSIS — Z833 Family history of diabetes mellitus: Secondary | ICD-10-CM | POA: Diagnosis not present

## 2018-01-21 DIAGNOSIS — G309 Alzheimer's disease, unspecified: Secondary | ICD-10-CM | POA: Diagnosis present

## 2018-01-21 DIAGNOSIS — E876 Hypokalemia: Secondary | ICD-10-CM | POA: Diagnosis present

## 2018-01-21 DIAGNOSIS — T7601XA Adult neglect or abandonment, suspected, initial encounter: Secondary | ICD-10-CM | POA: Diagnosis present

## 2018-01-21 DIAGNOSIS — E871 Hypo-osmolality and hyponatremia: Secondary | ICD-10-CM | POA: Diagnosis present

## 2018-01-21 DIAGNOSIS — M8580 Other specified disorders of bone density and structure, unspecified site: Secondary | ICD-10-CM | POA: Diagnosis present

## 2018-01-21 LAB — URINALYSIS, ROUTINE W REFLEX MICROSCOPIC
Bilirubin Urine: NEGATIVE
GLUCOSE, UA: NEGATIVE mg/dL
Ketones, ur: NEGATIVE mg/dL
Nitrite: NEGATIVE
PH: 5 (ref 5.0–8.0)
Protein, ur: 30 mg/dL — AB
SPECIFIC GRAVITY, URINE: 1.016 (ref 1.005–1.030)

## 2018-01-21 LAB — CBC
HCT: 32.6 % — ABNORMAL LOW (ref 36.0–46.0)
HEMATOCRIT: 33.9 % — AB (ref 36.0–46.0)
Hemoglobin: 11.2 g/dL — ABNORMAL LOW (ref 12.0–15.0)
Hemoglobin: 11.5 g/dL — ABNORMAL LOW (ref 12.0–15.0)
MCH: 30.9 pg (ref 26.0–34.0)
MCH: 30.1 pg (ref 26.0–34.0)
MCHC: 34.4 g/dL (ref 30.0–36.0)
MCHC: 33.9 g/dL (ref 30.0–36.0)
MCV: 89.8 fL (ref 80.0–100.0)
MCV: 88.7 fL (ref 80.0–100.0)
PLATELETS: 23 10*3/uL — AB (ref 150–400)
Platelets: 25 10*3/uL — CL (ref 150–400)
RBC: 3.63 MIL/uL — ABNORMAL LOW (ref 3.87–5.11)
RBC: 3.82 MIL/uL — ABNORMAL LOW (ref 3.87–5.11)
RDW: 13.4 % (ref 11.5–15.5)
RDW: 13.3 % (ref 11.5–15.5)
WBC: 5.9 10*3/uL (ref 4.0–10.5)
WBC: 5.4 10*3/uL (ref 4.0–10.5)
nRBC: 0.7 % — ABNORMAL HIGH (ref 0.0–0.2)
nRBC: 1.5 % — ABNORMAL HIGH (ref 0.0–0.2)

## 2018-01-21 LAB — COMPREHENSIVE METABOLIC PANEL
ALK PHOS: 86 U/L (ref 38–126)
ALT: 60 U/L — ABNORMAL HIGH (ref 0–44)
AST: 99 U/L — ABNORMAL HIGH (ref 15–41)
Albumin: 1.3 g/dL — ABNORMAL LOW (ref 3.5–5.0)
Anion gap: 10 (ref 5–15)
BUN: 44 mg/dL — ABNORMAL HIGH (ref 8–23)
CO2: 18 mmol/L — ABNORMAL LOW (ref 22–32)
Calcium: 7.6 mg/dL — ABNORMAL LOW (ref 8.9–10.3)
Chloride: 103 mmol/L (ref 98–111)
Creatinine, Ser: 1.12 mg/dL — ABNORMAL HIGH (ref 0.44–1.00)
GFR calc Af Amer: 58 mL/min — ABNORMAL LOW (ref >60)
GFR calc non Af Amer: 50 mL/min — ABNORMAL LOW (ref >60)
Glucose, Bld: 93 mg/dL (ref 70–99)
Potassium: 3 mmol/L — ABNORMAL LOW (ref 3.5–5.1)
Sodium: 131 mmol/L — ABNORMAL LOW (ref 135–145)
Total Bilirubin: 2.6 mg/dL — ABNORMAL HIGH (ref 0.3–1.2)
Total Protein: 4.2 g/dL — ABNORMAL LOW (ref 6.5–8.1)

## 2018-01-21 LAB — POCT I-STAT 3, VENOUS BLOOD GAS (G3P V)
Acid-base deficit: 8 mmol/L — ABNORMAL HIGH (ref 0.0–2.0)
Bicarbonate: 19.8 mmol/L — ABNORMAL LOW (ref 20.0–28.0)
O2 Saturation: 93 %
Patient temperature: 37
TCO2: 21 mmol/L — ABNORMAL LOW (ref 22–32)
pCO2, Ven: 46.8 mmHg (ref 44.0–60.0)
pH, Ven: 7.234 — ABNORMAL LOW (ref 7.250–7.430)
pO2, Ven: 81 mmHg — ABNORMAL HIGH (ref 32.0–45.0)

## 2018-01-21 LAB — BASIC METABOLIC PANEL
ANION GAP: 12 (ref 5–15)
Anion gap: 15 (ref 5–15)
BUN: 45 mg/dL — ABNORMAL HIGH (ref 8–23)
BUN: 45 mg/dL — AB (ref 8–23)
CO2: 14 mmol/L — ABNORMAL LOW (ref 22–32)
CO2: 11 mmol/L — ABNORMAL LOW (ref 22–32)
Calcium: 7.6 mg/dL — ABNORMAL LOW (ref 8.9–10.3)
Calcium: 7.4 mg/dL — ABNORMAL LOW (ref 8.9–10.3)
Chloride: 103 mmol/L (ref 98–111)
Chloride: 105 mmol/L (ref 98–111)
Creatinine, Ser: 1.29 mg/dL — ABNORMAL HIGH (ref 0.44–1.00)
Creatinine, Ser: 1.56 mg/dL — ABNORMAL HIGH (ref 0.44–1.00)
GFR calc Af Amer: 49 mL/min — ABNORMAL LOW (ref >60)
GFR calc Af Amer: 39 mL/min — ABNORMAL LOW (ref >60)
GFR calc non Af Amer: 42 mL/min — ABNORMAL LOW (ref >60)
GFR calc non Af Amer: 34 mL/min — ABNORMAL LOW (ref >60)
GLUCOSE: 50 mg/dL — AB (ref 70–99)
Glucose, Bld: 96 mg/dL (ref 70–99)
Potassium: 3 mmol/L — ABNORMAL LOW (ref 3.5–5.1)
Potassium: 3.7 mmol/L (ref 3.5–5.1)
Sodium: 129 mmol/L — ABNORMAL LOW (ref 135–145)
Sodium: 131 mmol/L — ABNORMAL LOW (ref 135–145)

## 2018-01-21 LAB — POCT I-STAT 3, ART BLOOD GAS (G3+)
ACID-BASE DEFICIT: 8 mmol/L — AB (ref 0.0–2.0)
Bicarbonate: 17.3 mmol/L — ABNORMAL LOW (ref 20.0–28.0)
O2 Saturation: 100 %
Patient temperature: 90.32
TCO2: 18 mmol/L — ABNORMAL LOW (ref 22–32)
pCO2 arterial: 27.6 mmHg — ABNORMAL LOW (ref 32.0–48.0)
pH, Arterial: 7.384 (ref 7.350–7.450)
pO2, Arterial: 162 mmHg — ABNORMAL HIGH (ref 83.0–108.0)

## 2018-01-21 LAB — MAGNESIUM
Magnesium: 1.4 mg/dL — ABNORMAL LOW (ref 1.7–2.4)
Magnesium: 1.4 mg/dL — ABNORMAL LOW (ref 1.7–2.4)

## 2018-01-21 LAB — BLOOD CULTURE ID PANEL (REFLEXED)

## 2018-01-21 LAB — PHOSPHORUS
Phosphorus: 3.2 mg/dL (ref 2.5–4.6)
Phosphorus: 2.6 mg/dL (ref 2.5–4.6)

## 2018-01-21 LAB — TROPONIN I
TROPONIN I: 0.07 ng/mL — AB (ref <0.03)
TROPONIN I: 0.19 ng/mL — AB (ref <0.03)
Troponin I: 0.04 ng/mL (ref <0.03)

## 2018-01-21 LAB — ACETAMINOPHEN LEVEL: Acetaminophen (Tylenol), Serum: <10 ug/mL — ABNORMAL LOW (ref 10–30)

## 2018-01-21 LAB — HEPATITIS PANEL, ACUTE
HCV Ab: <0.1 s/co ratio (ref 0.0–0.9)
HEP A IGM: NEGATIVE
Hep B C IgM: NEGATIVE
Hepatitis B Surface Ag: NEGATIVE

## 2018-01-21 LAB — MRSA PCR SCREENING: MRSA BY PCR: NEGATIVE

## 2018-01-21 LAB — HIV ANTIBODY (ROUTINE TESTING W REFLEX): HIV Screen 4th Generation wRfx: NONREACTIVE

## 2018-01-21 LAB — SODIUM, URINE, RANDOM: Sodium, Ur: <10 mmol/L

## 2018-01-21 LAB — CG4 I-STAT (LACTIC ACID): Lactic Acid, Venous: 2.62 mmol/L (ref 0.5–1.9)

## 2018-01-21 LAB — LACTIC ACID, PLASMA
Lactic Acid, Venous: 2.5 mmol/L (ref 0.5–1.9)
Lactic Acid, Venous: 7 mmol/L (ref 0.5–1.9)

## 2018-01-21 LAB — PROTIME-INR
INR: 1.5
Prothrombin Time: 18 seconds — ABNORMAL HIGH (ref 11.4–15.2)

## 2018-01-21 LAB — OSMOLALITY: Osmolality: 281 mOsm/kg (ref 275–295)

## 2018-01-21 LAB — SALICYLATE LEVEL: Salicylate Lvl: <7 mg/dL (ref 2.8–30.0)

## 2018-01-21 LAB — OSMOLALITY, URINE: Osmolality, Ur: 463 mOsm/kg (ref 300–900)

## 2018-01-21 LAB — AMMONIA: Ammonia: 23 umol/L (ref 9–35)

## 2018-01-21 MED ORDER — POTASSIUM CHLORIDE 10 MEQ/50ML IV SOLN
10.0000 meq | INTRAVENOUS | Status: AC
  Administered 2018-01-21 (×2): 10 meq via INTRAVENOUS
  Filled 2018-01-21 (×2): qty 50

## 2018-01-21 MED ORDER — VANCOMYCIN HCL IN DEXTROSE 1-5 GM/200ML-% IV SOLN: 1000.0000 mg | INTRAVENOUS | Status: DC

## 2018-01-21 MED ORDER — CHLORHEXIDINE GLUCONATE 0.12% ORAL RINSE (MEDLINE KIT)
15.0000 mL | Freq: Two times a day (BID) | OROMUCOSAL | Status: DC
  Administered 2018-01-21: 15 mL via OROMUCOSAL

## 2018-01-21 MED ORDER — SODIUM CHLORIDE 0.9 % IV BOLUS
1000.0000 mL | Freq: Once | INTRAVENOUS | Status: AC
  Administered 2018-01-21: 1000 mL via INTRAVENOUS

## 2018-01-21 MED ORDER — MAGNESIUM SULFATE 2 GM/50ML IV SOLN
2.0000 g | Freq: Once | INTRAVENOUS | Status: AC
  Administered 2018-01-21: 2 g via INTRAVENOUS
  Filled 2018-01-21: qty 50

## 2018-01-21 MED ORDER — HYDROCORTISONE NA SUCCINATE PF 100 MG IJ SOLR
50.0000 mg | Freq: Four times a day (QID) | INTRAMUSCULAR | Status: DC
  Administered 2018-01-21: 50 mg via INTRAVENOUS
  Filled 2018-01-21: qty 2

## 2018-01-21 MED ORDER — ADULT MULTIVITAMIN LIQUID CH
15.0000 mL | Freq: Every day | ORAL | Status: DC
  Administered 2018-01-21: 15 mL
  Filled 2018-01-21: qty 15

## 2018-01-21 MED ORDER — VANCOMYCIN HCL 10 G IV SOLR
1500.0000 mg | INTRAVENOUS | Status: AC
  Administered 2018-01-21: 1500 mg via INTRAVENOUS
  Filled 2018-01-21: qty 1500

## 2018-01-21 MED ORDER — SODIUM BICARBONATE 8.4 % IV SOLN
INTRAVENOUS | Status: DC
  Administered 2018-01-21: 10:00:00 via INTRAVENOUS
  Filled 2018-01-21 (×2): qty 150

## 2018-01-21 MED ORDER — FENTANYL CITRATE (PF) 100 MCG/2ML IJ SOLN: 50.0000 ug | INTRAMUSCULAR | Status: DC | PRN

## 2018-01-21 MED ORDER — FENTANYL BOLUS VIA INFUSION
25.0000 ug | INTRAVENOUS | Status: DC | PRN
  Filled 2018-01-21: qty 25

## 2018-01-21 MED ORDER — ORAL CARE MOUTH RINSE
15.0000 mL | OROMUCOSAL | Status: DC
  Administered 2018-01-21: 15 mL via OROMUCOSAL

## 2018-01-21 MED ORDER — SODIUM CHLORIDE 0.9 % IV SOLN: INTRAVENOUS | Status: DC | PRN

## 2018-01-21 MED ORDER — SODIUM CHLORIDE 0.9 % IV SOLN: 2.0000 g | INTRAVENOUS | Status: DC

## 2018-01-21 MED ORDER — POTASSIUM CHLORIDE 20 MEQ/15ML (10%) PO SOLN: 20.0000 meq | Freq: Once | ORAL | Status: DC

## 2018-01-21 MED ORDER — ETOMIDATE 2 MG/ML IV SOLN
INTRAVENOUS | Status: AC | PRN
  Administered 2018-01-21: 20 mg via INTRAVENOUS

## 2018-01-21 MED ORDER — SUCCINYLCHOLINE CHLORIDE 20 MG/ML IJ SOLN
INTRAMUSCULAR | Status: AC | PRN
  Administered 2018-01-21: 100 mg via INTRAVENOUS

## 2018-01-21 MED ORDER — FENTANYL 2500MCG IN NS 250ML (10MCG/ML) PREMIX INFUSION
0.0000 ug/h | INTRAVENOUS | Status: DC
  Administered 2018-01-21: 2.5 ug/h via INTRAVENOUS
  Filled 2018-01-21: qty 250

## 2018-01-21 MED ORDER — NOREPINEPHRINE 16 MG/250ML-% IV SOLN
0.0000 ug/min | INTRAVENOUS | Status: DC
  Administered 2018-01-21: 40 ug/min via INTRAVENOUS
  Filled 2018-01-21: qty 250

## 2018-01-21 MED ORDER — NOREPINEPHRINE-SODIUM CHLORIDE 4-0.9 MG/250ML-% IV SOLN
0.0000 ug/min | INTRAVENOUS | Status: DC
  Administered 2018-01-21: 5 ug/min via INTRAVENOUS
  Administered 2018-01-21: 35 ug/min via INTRAVENOUS
  Filled 2018-01-21: qty 250

## 2018-01-21 MED ORDER — SODIUM CHLORIDE 0.9 % IV SOLN
2.0000 g | INTRAVENOUS | Status: AC
  Administered 2018-01-21: 2 g via INTRAVENOUS
  Filled 2018-01-21: qty 2

## 2018-01-21 MED ORDER — BISACODYL 10 MG RE SUPP: 10.0000 mg | Freq: Every day | RECTAL | Status: DC | PRN

## 2018-01-21 MED ORDER — VANCOMYCIN HCL IN DEXTROSE 750-5 MG/150ML-% IV SOLN: 750.0000 mg | INTRAVENOUS | Status: DC

## 2018-01-21 MED ORDER — FENTANYL 2500MCG IN NS 250ML (10MCG/ML) PREMIX INFUSION: 25.0000 ug/h | INTRAVENOUS | Status: DC

## 2018-01-21 MED ORDER — DOCUSATE SODIUM 50 MG/5ML PO LIQD
100.0000 mg | Freq: Two times a day (BID) | ORAL | Status: DC | PRN
  Filled 2018-01-21: qty 10

## 2018-01-21 MED ORDER — VASOPRESSIN 20 UNIT/ML IV SOLN
0.0300 [IU]/min | INTRAVENOUS | Status: DC
  Administered 2018-01-21: 0.03 [IU]/min via INTRAVENOUS
  Filled 2018-01-21: qty 2

## 2018-01-21 MED ORDER — POTASSIUM CHLORIDE 20 MEQ/15ML (10%) PO SOLN
40.0000 meq | Freq: Once | ORAL | Status: AC
  Administered 2018-01-21: 40 meq
  Filled 2018-01-21: qty 30

## 2018-01-21 MED ORDER — FAMOTIDINE 40 MG/5ML PO SUSR
20.0000 mg | Freq: Two times a day (BID) | ORAL | Status: DC
  Administered 2018-01-21: 20 mg
  Filled 2018-01-21 (×2): qty 2.5

## 2018-01-21 MED ORDER — VITAMIN B-1 100 MG PO TABS
100.0000 mg | ORAL_TABLET | Freq: Every day | ORAL | Status: DC
  Administered 2018-01-21: 100 mg
  Filled 2018-01-21: qty 1

## 2018-01-21 MED ORDER — FOLIC ACID 1 MG PO TABS
1.0000 mg | ORAL_TABLET | Freq: Every day | ORAL | Status: DC
  Administered 2018-01-21: 1 mg
  Filled 2018-01-21: qty 1

## 2018-01-22 LAB — CBC WITH DIFFERENTIAL/PLATELET
Abs Immature Granulocytes: 1.82 10*3/uL — ABNORMAL HIGH (ref 0.00–0.07)
Basophils Absolute: 0.1 10*3/uL (ref 0.0–0.1)
Basophils Relative: 1 %
Eosinophils Absolute: 0.1 10*3/uL (ref 0.0–0.5)
Eosinophils Relative: 1 %
HEMATOCRIT: 35.9 % — AB (ref 36.0–46.0)
Hemoglobin: 12.6 g/dL (ref 12.0–15.0)
IMMATURE GRANULOCYTES: 25 %
Lymphocytes Relative: 5 %
Lymphs Abs: 0.4 10*3/uL — ABNORMAL LOW (ref 0.7–4.0)
MCH: 31.2 pg (ref 26.0–34.0)
MCHC: 35.1 g/dL (ref 30.0–36.0)
MCV: 88.9 fL (ref 80.0–100.0)
MONO ABS: 0.2 10*3/uL (ref 0.1–1.0)
Monocytes Relative: 3 %
Neutro Abs: 4.8 10*3/uL (ref 1.7–7.7)
Neutrophils Relative %: 65 %
Platelets: 22 10*3/uL — CL (ref 150–400)
RBC: 4.04 MIL/uL (ref 3.87–5.11)
RDW: 13.3 % (ref 11.5–15.5)
WBC: 7.4 10*3/uL (ref 4.0–10.5)
nRBC: 0.3 % — ABNORMAL HIGH (ref 0.0–0.2)

## 2018-01-22 MED FILL — Medication: Qty: 1 | Status: AC

## 2018-01-23 LAB — URINE CULTURE: Culture: >=100000 — AB

## 2018-01-24 LAB — CULTURE, BLOOD (ROUTINE X 2): SPECIAL REQUESTS: ADEQUATE

## 2018-02-11 NOTE — Discharge Summary (Addendum)
Physician Death Summary  Patient ID: MARQUISHA SWEEN MRN: [id number] DOB/AGE: 70-Dec-1952 70 y.o.  Admit date: 02-Feb-2018 Discharge date: 01/25/2018  Admission Diagnoses: Altered mental status  Discharge Diagnoses:  Septic shock Multi Organ failure Lactic acidosis Streptococcus bacteremia  Discharged Condition: Deceased  Hospital Course: 68 year old woman with Alzheimer dementia, glucoma, HTN, psoriasis, depression presenting with acute encephalopathy, slurred speech. History per EMR/ED/EMS. Reportedly last normal on Tuesday. Family reported to EMS that patient was confused. Daughter had stopped Alzheimer medications for concern of it affecting her mental status several weeks ago.   Discussed with daughter. Daughter and daughter's husband and two other "caregivers" are involved in her care. Daughter is primary caregiver and has been feeling sick past few days and relied on caregivers and husband more. Was able to check on her personally today. Daughter's husband noted that she had been having poor PO intake. Daughter notes that patient picks at her skin.  During the course of the night she continued to worsen requiring multiple pressors with worsening renal failure, progressive lactic acidosis.  Blood cultures were positive for Streptococcus bacteremia.  Discussion was held with the family and she was made DNR. Later in the day she went into PEA arrest and passed away.    Signed: Jeyson Deshotel 01/17/2018, 1:58 PM
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
## 2018-02-11 NOTE — Code Documentation (Addendum)
Pt intubated. Good color change noted

## 2018-02-11 NOTE — Progress Notes (Signed)
Attempted aline per MD order but unable to obtain. Kreg Shropshire, NP and RN made aware.

## 2018-02-11 NOTE — ED Notes (Signed)
CCM at bedside 
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
## 2018-02-11 NOTE — Progress Notes (Signed)
Pt continues to be hypotensive.  Spoke to CCM- insert a line, admin 1 L bolus NS, and CVP monitoring.  Will continue to monitor.

## 2018-02-11 NOTE — ED Notes (Signed)
Bair hugger placed on pt.

## 2018-02-11 NOTE — Progress Notes (Signed)
PHARMACY - PHYSICIAN COMMUNICATION CRITICAL VALUE ALERT - BLOOD CULTURE IDENTIFICATION (BCID)  Briana Taylor is an 68 y.o. female who presented to [facility] on 2018/02/04 with AMS.  Assessment:  62 YOF with septic shock, multiple skin lesions however source not clear.  MRSA PCR negative, trach aspirate pending.  LA 7, WBC wnl, intubated, on 2 pressors.  Name of physician (or Provider) Contacted: Zenia Resides  Current antibiotics: Vanc/cefepime  Changes to prescribed antibiotics recommended:  D/c vancomycin  Results for orders placed or performed during the hospital encounter of 2018-02-04  Blood Culture ID Panel (Reflexed) (Collected: 04-Feb-2018 10:40 PM)  Result Value Ref Range   Enterococcus species NOT DETECTED NOT DETECTED   Listeria monocytogenes NOT DETECTED NOT DETECTED   Staphylococcus species NOT DETECTED NOT DETECTED   Staphylococcus aureus (BCID) NOT DETECTED NOT DETECTED   Streptococcus species DETECTED (A) NOT DETECTED   Streptococcus agalactiae NOT DETECTED NOT DETECTED   Streptococcus pneumoniae NOT DETECTED NOT DETECTED   Streptococcus pyogenes NOT DETECTED NOT DETECTED   Acinetobacter baumannii NOT DETECTED NOT DETECTED   Enterobacteriaceae species NOT DETECTED NOT DETECTED   Enterobacter cloacae complex NOT DETECTED NOT DETECTED   Escherichia coli NOT DETECTED NOT DETECTED   Klebsiella oxytoca NOT DETECTED NOT DETECTED   Klebsiella pneumoniae NOT DETECTED NOT DETECTED   Proteus species NOT DETECTED NOT DETECTED   Serratia marcescens NOT DETECTED NOT DETECTED   Haemophilus influenzae NOT DETECTED NOT DETECTED   Neisseria meningitidis NOT DETECTED NOT DETECTED   Pseudomonas aeruginosa NOT DETECTED NOT DETECTED   Candida albicans NOT DETECTED NOT DETECTED   Candida glabrata NOT DETECTED NOT DETECTED   Candida krusei NOT DETECTED NOT DETECTED   Candida parapsilosis NOT DETECTED NOT DETECTED   Candida tropicalis NOT DETECTED NOT DETECTED    Daylene Posey 01/15/2018  12:08 PM

## 2018-02-11 NOTE — ED Notes (Addendum)
Pt given 100 mcg bolus dose IV of Fentanyl @ 0300 right before transfer from ED to [facility] due to patient waking up from seduation. Unit floor nurse aware but unable to chart in [facility]

## 2018-02-11 NOTE — Progress Notes (Signed)
eLink Physician-Brief Progress Note Patient Name: Briana Taylor DOB: [birth year] MRN: [id number]   Date of Service  01/20/2018  HPI/Events of Note  L IJ TLC inserted. Reviewed CXR and this is in good position.  No pneumothorax.  ETT needs to be withdrawn 2cm.   Pt hypotensive despite max dose of Levophed.  eICU Interventions  Start on vasopressin. Pt has been fluid resuscitated with 4L in the ED.     Intervention Category Major Interventions: Shock - evaluation and management  Larinda Buttery 02/09/2018, 6:10 AM

## 2018-02-11 NOTE — H&P (Signed)
NAME:  Sheron NightingaleZona L Whipple, MRN:  [id number], DOB:  [birth year], LOS: 0 ADMISSION DATE:  10/25/18, CONSULTATION DATE:  1/11 REFERRING MD:  ED, CHIEF COMPLAINT:  Acute encephalopathy  History of present illness   68 year old woman with Alzheimer dementia, glucoma, HTN, psoriasis, depression presenting with acute encephalopathy, slurred speech. History per EMR/ED/EMS. Reportedly last normal on Tuesday. Family reported to EMS that patient was confused. Daughter had stopped Alzheimer medications for concern of it affecting her mental status several weeks ago.   Discussed with daughter. Daughter and daughter's husband and two other "caregivers" are involved in her care. Daughter is primary caregiver and has been feeling sick past few days and relied on caregivers and husband more. Was able to check on her personally today. Daughter's husband noted that she had been having poor PO intake. Daughter notes that patient picks at her skin.  Past Medical History   Alzheimer dementia, glucoma, HTN, psoriasis, depression  Significant Hospital Events   1/10 > intubated, admit  Consults:  None  Procedures:  None  Significant Diagnostic Tests:  CT c spine, head w/o contrast on 1/11:  1. No acute intracranial abnormality or calvarial fracture. 2. No acute fracture or dislocation of cervical spine. 3. Stable chronic microvascular ischemic changes and volume loss of the brain 4. Stable cervical spondylosis greatest at C5-C7 levels. No high-grade bony canal stenosis.  XR chest 1/11: Pulmonary vascular congestion.  Micro Data:  Blood cultures 1/10 Urine culture Resp culture RVP  Antimicrobials:  Vanc 1/11> Cefepime 1/11>  Interim history/subjective:  Intubated/sedated  Objective   Blood pressure 105/88, pulse 70, temperature (!) 88.3 F (31.3 C), temperature source Bladder, resp. rate 20, SpO2 100 %.    Vent Mode: PRVC FiO2 (%):  [40 %] 40 % Set Rate:  [15 bmp] 15 bmp Vt Set:  [500 mL] 500  mL PEEP:  [5 cmH20] 5 cmH20   Intake/Output Summary (Last 24 hours) at 01/19/2018 0343 Last data filed at 02/05/2018 0225 Gross per 24 hour  Intake 1000 ml  Output 100 ml  Net 900 ml   There were no vitals filed for this visit.  Examination: General: Sedated HENT: PERRL, anicteric sclera, [place]/AT Lungs: Clear bilaterally, no wheezes Cardiovascular: Bradycardic but regular Abdomen: Soft, nontender, nondistended Extremities: BLE mottling Neuro: Moves all four extremities spontaneously, sedated  Assessment & Plan:  68 year old woman with Alzheimer dementia, glucoma, HTN, psoriasis, depression presenting with acute encephalopathy, slurred speech.  Acute Encephalopathy, Probable Septic Shock: Hypothermic to 83.5 without leukocytosis but mild left shift present. Received 4L NS in ED. EKG with atrial fibrillation, prolonged QTc, no previous EKG for comparison. Troponin 0.04. TSH normal. -Follow up blood cultures. -Obtain UA, urine culture, respiratory culture and RVP -Check lactic acid -Wound care consult -Titrate Levo gtt prn for MAP goal >65 -Vanc/cefepime for now -Trend trops -Repeat EKG in AM -TTE -Continue vent for airway protection. SAT/SBT in AM.   AKI: Cr 1.16. Unknown baseline  Hyponatremia, hypokalemia, metabolic acidosis without anion gap: Na 129, K 3.2, Bicarb 19.  -Obtain serum and urine osm, urine Na -20meq KCl given. Check Mg. -Repeat BMP  Transaminitis: AST 80, ALT 57 -Repeat CMP -Check acute hepatitis panel and tylenol level -US abdomen  Thrombocytopenia: Plt 22. Possible that it is related to first problem. -Evaluate liver function with coags -thiamine/folate/MVI -Continue to monitor  Best practice:  Diet: NPO Pain/Anxiety/Delirium protocol (if indicated): fent gtt and prn for goal RASS 0 VAP protocol (if indicated): ordered DVT prophylaxis: SCDs GI prophylaxis:  Pepcid Glucose control: monitor Mobility: PT Code Status: Full Family Communication:  Updated daughter and daughter's husband 1/11 Disposition: ICU  Labs   CBC: Recent Labs  Lab 01/29/2018 2240 Feb 09, 2018 0144  WBC 7.4 5.9  NEUTROABS 4.8  --   HGB 12.6 11.2*  HCT 35.9* 32.6*  MCV 88.9 89.8  PLT 22* 23*    Basic Metabolic Panel: Recent Labs  Lab 01/13/2018 2240 09-Feb-2018 0144  NA 129* 131*  K 3.2* 3.0*  CL 101 103  CO2 19* 18*  GLUCOSE 106* 93  BUN 44* 44*  CREATININE 1.16* 1.12*  CALCIUM 8.0* 7.6*  MG  --  1.4*  PHOS  --  3.2   GFR: CrCl cannot be calculated (Unknown ideal weight.). Recent Labs  Lab 02/04/2018 2240 2018/02/09 0144 Feb 09, 2018 0325  WBC 7.4 5.9  --   LATICACIDVEN  --  2.5* 2.62*    Liver Function Tests: Recent Labs  Lab 02/05/2018 2240 February 09, 2018 0144  AST 80* 99*  ALT 57* 60*  ALKPHOS 94 86  BILITOT 2.6* 2.6*  PROT 4.9* 4.2*  ALBUMIN 1.6* 1.3*    ABG    Component Value Date/Time   HCO3 19.8 (L) 09-Feb-2018 0326   TCO2 21 (L) 02/09/18 0326   ACIDBASEDEF 8.0 (H) 2018-02-09 0326   O2SAT 93.0 2018-02-09 0326     Coagulation Profile: Recent Labs  Lab 2018-02-09 0144  INR 1.50    Cardiac Enzymes: Recent Labs  Lab 01/28/2018 2240  CKTOTAL 287*  TROPONINI 0.04*    HbA1C: No results found for: HGBA1C  CBG: Recent Labs  Lab 02/09/2018 2234  GLUCAP 111*    Review of Systems:   Unable to obtain due to intubation/sedation  Past Medical History  She,  has a past medical history of Alzheimer's disease (06/21/2014), Bradycardia, Glaucoma, Glaucoma, Hypertension, Illiterate, Osteopenia, Psoriasis, Tinea unguium, and Vitamin D deficiency.   Surgical History    Past Surgical History:  Procedure Laterality Date  . APPENDECTOMY  1970  . CHOLECYSTECTOMY    . fx thumb  1988  . right glaucoma  05/24/12  . TONSILLECTOMY    . TUBAL LIGATION       Social History   reports that she has quit smoking. She has never used smokeless tobacco. She reports that she does not drink alcohol or use drugs.   Family History   Her family  history includes CAD in her mother; Diabetes in her mother; Heart attack in her mother; Hyperlipidemia in her mother; Hypertension in her maternal grandfather, maternal grandmother, mother, paternal grandfather, and paternal grandmother.   Allergies No Known Allergies   Home Medications  Prior to Admission medications   Medication Sig Start Date End Date Taking? Authorizing Provider  aspirin 81 MG tablet Take 81 mg by mouth daily.    [provider]  b complex vitamins tablet Take 1 tablet by mouth daily.    [provider]  calcium-vitamin D (OSCAL) 250-125 MG-UNIT per tablet Take 1 tablet by mouth daily.    [provider]  clobetasol cream (TEMOVATE) 0.05 % Apply 1 application topically 2 (two) times daily.    [provider]  memantine (NAMENDA) 10 MG tablet Take 1 tablet (10 mg total) by mouth 2 (two) times daily. 10/18/14   York Spaniel, MD  Multiple Vitamins-Minerals (CENTRUM SILVER ADULT 50+ PO) Take by mouth.    [provider]  Omega-3 Fatty Acids (OMEGA 3 PO) Take 1 capsule by mouth daily.    [provider]  sertraline (ZOLOFT) 50 MG tablet One tablet daily for 2 weeks, then take 2 tablets daily 06/21/14   York Spaniel, MD     Critical care time: The patient is critically ill with multiple organ systems failure and requires high complexity decision making for assessment and support, frequent evaluation and titration of therapies, application of advanced monitoring technologies and extensive interpretation of multiple databases.   Critical Care Time devoted to patient care services described in this note is  55 Minutes. This time reflects time of care of this signee. This critical care time does not reflect procedure time, or teaching time or supervisory time of PA/NP/Med student/Med Resident etc but could involve care discussion time.  Griffin Basil, M.D. [facility] Pulmonary/Critical Care Medicine After hours pager:  [phone].

## 2018-02-11 NOTE — Progress Notes (Signed)
Contacted e-link concerning pt dropping BP. While on line with e-link pt BP increased slightly. Informed the oncoming provider would be notified of the continuing drop and unstable pressures.

## 2018-02-11 NOTE — Progress Notes (Signed)
   01/20/2018 1330  Clinical Encounter Type  Visited With Patient and family together  Visit Type Initial;Death;Spiritual support  Referral From Nurse  Consult/Referral To Chaplain  The chaplain joined the Pt. family, daughter and husband after the Pt. died.  The family and chaplain spent 30 minutes discussing the Pt. and daughters spirituality at EOL.  The chaplain assisted the RN with Pt. Placement information for funeral home selection.  The chaplain prayed with the family and walked with them to the chapel.

## 2018-02-11 NOTE — Progress Notes (Addendum)
CRITICAL VALUE ALERT  Critical Value:  Lactic acid 7.0  Date & Time Notied:  1/11  Provider Notified:  Zenia Resides NP  Orders Received/Actions taken: no new orders at this time

## 2018-02-11 NOTE — Progress Notes (Signed)
NAME:  Briana NightingaleZona L Taylor, MRN:  [id number], DOB:  [birth year], LOS: 0 ADMISSION DATE:  02/04/2018, CONSULTATION DATE:  1/11 REFERRING MD:  ED, CHIEF COMPLAINT:  Acute encephalopathy  History of present illness   68 year old woman with Alzheimer dementia, glucoma, HTN, psoriasis, depression presenting with acute encephalopathy, slurred speech. History per EMR/ED/EMS. Reportedly last normal on Tuesday. Family reported to EMS that patient was confused. Daughter had stopped Alzheimer medications for concern of it affecting her mental status several weeks ago.  Daughter's husband noted that she had been having poor PO intake. Daughter notes that patient picks at her skin.  Past Medical History   Alzheimer dementia, glucoma, HTN, psoriasis, depression  Significant Hospital Events   1/10 > intubated, admit 1/11: Progressive and refractory shock now on 2 pressors.  Worsening renal function.  Mottled, cool, acidemic.  Working diagnosis profound septic shock with mods, source remains unclear. Consults:  None  Procedures:  None  Significant Diagnostic Tests:  CT c spine, head w/o contrast on 1/11:  1. No acute intracranial abnormality or calvarial fracture. 2. No acute fracture or dislocation of cervical spine. 3. Stable chronic microvascular ischemic changes and volume loss of the brain 4. Stable cervical spondylosis greatest at C5-C7 levels. No high-grade bony canal stenosis.  XR chest 1/11: Pulmonary vascular congestion.  Micro Data:  Blood cultures 1/10 Urine culture Resp culture RVP  Antimicrobials:  Vanc 1/11> Cefepime 1/11>  Interim history/subjective:  Requiring escalating pressors Objective   Blood pressure (Abnormal) 107/40, pulse (Abnormal) 37, temperature (Abnormal) 97.2 F (36.2 C), resp. rate (Abnormal) 36, height 5\' 4"  (1.626 m), weight 84.1 kg, SpO2 96 %.    Vent Mode: PRVC FiO2 (%):  [40 %] 40 % Set Rate:  [15 bmp] 15 bmp Vt Set:  [500 mL] 500 mL PEEP:  [5 cmH20] 5  cmH20 Plateau Pressure:  [17 cmH20] 17 cmH20   Intake/Output Summary (Last 24 hours) at 27-May-2018 0827 Last data filed at 27-May-2018 0800 Gross per 24 hour  Intake 1297.71 ml  Output 183 ml  Net 1114.71 ml   Filed Weights   02-27-2018 0307  Weight: 84.1 kg    Examination: General: Sedated HENT: PERRL, anicteric sclera, [place]/AT Lungs: Clear bilaterally, no wheezes Cardiovascular: Bradycardic but regular Abdomen: Soft, nontender, nondistended Extremities: BLE mottling Neuro: Moves all four extremities spontaneously, sedated  Assessment & Plan:  68 year old woman with Alzheimer dementia, glucoma, HTN, psoriasis, depression presenting with acute encephalopathy, slurred speech.  Acute Metabolic Encephalopathy in setting of profound sepsis/septic shock mods with worsening acidosis This is superimposed on underlying dementia Plan Supportive care PAD protocol RASS goal 0 Checking ammonia level  Refractory shock/mods. -Working diagnosis is septic shock.  Has multiple skin lesions wonder about bacteremia -No other clear sources of infection today all cultures still negative -Now on 2 pressors with worsening renal function, mottled and cool Plan Continue to titrate norepinephrine for mean arterial pressure greater than 65 Added vasopressin Transduce central venous pressure, checking cardiac index and pulse pressure variation with noninvasive markers for volume responsiveness Adding bicarbonate infusion Checking lactic acid Adding stress dose steroids Day #1 vancomycin and cefepime Follow-up cultures Have asked family to come to bedside, we need to address goals of care, I am afraid she is going to arrest  Acute kidney injury, with profound electrolyte derangements including: Hyponatremia, hypokalemia, hypomagnesemia and non-anion gap metabolic acidosis. Plan Continuing volume resuscitation Checking central venous pressure Replacing electrolytes Recheck all labs later this  morning Bicarbonate infusion with serial chemistries Follow-up  renal ultrasound  Transaminitis: AST 80, ALT 57 Plan Trend LFTs Abdominal ultrasound Ammonia level   Thrombocytopenia: Possibly related to sepsis Plan Supportive care no indication for transfusion at this point   Best practice:  Diet: NPO Pain/Anxiety/Delirium protocol (if indicated): fent gtt and prn for goal RASS 0 VAP protocol (if indicated): ordered DVT prophylaxis: SCDs GI prophylaxis: Pepcid Glucose control: monitor Mobility: PT Code Status: Full Family Communication: Updated daughter and daughter's husband 1/11 Disposition: She remains critically ill requiring ICU level care for titration of vasoactive drips, careful interpretation of hemodynamics, close observation of metabolic derangements and replacement of electrolytes.  Clinically she is deteriorated since admission, I am not sure she will survive.  Have asked nursing staff to reach out to family, we need to discuss goals of care.  Critical care time: 35 minutes    Briana MartinetPeter E  ACNP-BC [facility] Pulmonary/Critical Care Pager # [phone] OR # [phone] if no answer

## 2018-02-11 NOTE — Progress Notes (Addendum)
Pharmacy Antibiotic Note  Sheron NightingaleZona L Taillon is a 68 y.o. female admitted on 12-14-18 with sepsis.  Pharmacy has been consulted for Vancomycin and Cefepime dosing.  Plan: Cefepime 2gm IV q24h Vancomycin 1500mg  IV now then Vancomycin 750 mg IV Q 24 hrs. Goal AUC 400-550. Expected AUC: 465 SCr used: 1.12  Will f/u micro data, renal function, and pt's clinical condition Vanc levels prn    Temp (24hrs), Avg:83.5 F (28.6 C), Min:83.5 F (28.6 C), Max:83.5 F (28.6 C)  Recent Labs  Lab May 30, 2018 2240  WBC 7.4  CREATININE 1.16*    CrCl cannot be calculated (Unknown ideal weight.).    No Known Allergies  Antimicrobials this admission: 1/11 Vanc >>  1/11 Cefepime >>   Microbiology results: 1/10 BCx:  1/11 UCx:   Trach asp:  Thank you for allowing pharmacy to be a part of this patient's care.  Christoper Fabianaron Eliu Batch, PharmD, BCPS Clinical pharmacist  **Pharmacist phone directory can now be found on [email or web address] ([email or web address]).  Listed under [facility]. 01/29/2018 2:15 AM

## 2018-02-11 NOTE — Progress Notes (Signed)
Pulled ETT back 2 cm. From 23 at the lips to 21 at the lips per MD

## 2018-02-11 NOTE — Procedures (Signed)
Central Venous Catheter Insertion Procedure Note Briana Taylor [id number] [birth year]  Procedure: Insertion of Central Venous Catheter Indications: Drug and/or fluid administration  Procedure Details Consent: Unable to obtain consent because of emergent medical necessity.  Time Out: Verified patient identification, verified procedure, site/side was marked, verified correct patient position, special equipment/implants available, medications/allergies/relevent history reviewed, required imaging and test results available.  Performed  Maximum sterile technique was used including antiseptics, cap, gloves, gown, hand hygiene, mask and sheet. Skin prep: Chlorhexidine; local anesthetic administered A antimicrobial bonded/coated triple lumen catheter was placed in the left internal jugular vein using the Seldinger technique. Ultrasound used for guidance of catheter placement.  Evaluation Blood flow good Complications: No apparent complications Patient did tolerate procedure well. Chest X-ray ordered to verify placement.  CXR: pending.  Griffin Basil 2018-02-09, 4:59 AM

## 2018-02-11 DEATH — deceased
